# Patient Record
Sex: Male | Born: 1985 | Race: Black or African American | Hispanic: No | Marital: Single | Smoking: Former smoker
Health system: Southern US, Community
[De-identification: ages and names within clinical notes are randomized; demographics above are authoritative.]

---

## 2004-03-03 ENCOUNTER — Ambulatory Visit: Payer: Self-pay | Admitting: Internal Medicine

## 2004-06-20 ENCOUNTER — Ambulatory Visit: Payer: Self-pay | Admitting: Family Medicine

## 2004-12-13 ENCOUNTER — Emergency Department (HOSPITAL_COMMUNITY): Admission: EM | Admit: 2004-12-13 | Discharge: 2004-12-13 | Payer: Self-pay | Admitting: Emergency Medicine

## 2004-12-15 ENCOUNTER — Ambulatory Visit: Payer: Self-pay | Admitting: Family Medicine

## 2005-03-14 ENCOUNTER — Ambulatory Visit: Payer: Self-pay | Admitting: Family Medicine

## 2005-03-19 ENCOUNTER — Ambulatory Visit: Payer: Self-pay | Admitting: Family Medicine

## 2005-04-02 ENCOUNTER — Emergency Department (HOSPITAL_COMMUNITY): Admission: EM | Admit: 2005-04-02 | Discharge: 2005-04-03 | Payer: Self-pay | Admitting: Emergency Medicine

## 2005-04-03 ENCOUNTER — Ambulatory Visit: Payer: Self-pay | Admitting: Family Medicine

## 2005-04-06 ENCOUNTER — Ambulatory Visit: Payer: Self-pay | Admitting: Family Medicine

## 2012-01-11 ENCOUNTER — Emergency Department (HOSPITAL_COMMUNITY)
Admission: EM | Admit: 2012-01-11 | Discharge: 2012-01-11 | Disposition: A | Discharge status: Home / Self Care | Payer: Self-pay | Attending: Emergency Medicine | Admitting: Emergency Medicine

## 2012-01-11 ENCOUNTER — Encounter (HOSPITAL_COMMUNITY): Payer: Self-pay | Admitting: Emergency Medicine

## 2012-01-11 ENCOUNTER — Emergency Department (HOSPITAL_COMMUNITY): Payer: Self-pay

## 2012-01-11 DIAGNOSIS — J029 Acute pharyngitis, unspecified: Secondary | ICD-10-CM | POA: Insufficient documentation

## 2012-01-11 DIAGNOSIS — IMO0001 Reserved for inherently not codable concepts without codable children: Secondary | ICD-10-CM | POA: Insufficient documentation

## 2012-01-11 DIAGNOSIS — R5381 Other malaise: Secondary | ICD-10-CM | POA: Insufficient documentation

## 2012-01-11 DIAGNOSIS — R110 Nausea: Secondary | ICD-10-CM | POA: Insufficient documentation

## 2012-01-11 DIAGNOSIS — F17200 Nicotine dependence, unspecified, uncomplicated: Secondary | ICD-10-CM | POA: Insufficient documentation

## 2012-01-11 DIAGNOSIS — R062 Wheezing: Secondary | ICD-10-CM | POA: Insufficient documentation

## 2012-01-11 DIAGNOSIS — R5383 Other fatigue: Secondary | ICD-10-CM | POA: Insufficient documentation

## 2012-01-11 DIAGNOSIS — J189 Pneumonia, unspecified organism: Secondary | ICD-10-CM | POA: Insufficient documentation

## 2012-01-11 DIAGNOSIS — J3489 Other specified disorders of nose and nasal sinuses: Secondary | ICD-10-CM | POA: Insufficient documentation

## 2012-01-11 DIAGNOSIS — H9209 Otalgia, unspecified ear: Secondary | ICD-10-CM | POA: Insufficient documentation

## 2012-01-11 DIAGNOSIS — R05 Cough: Secondary | ICD-10-CM | POA: Insufficient documentation

## 2012-01-11 DIAGNOSIS — R509 Fever, unspecified: Secondary | ICD-10-CM | POA: Insufficient documentation

## 2012-01-11 DIAGNOSIS — R0602 Shortness of breath: Secondary | ICD-10-CM | POA: Insufficient documentation

## 2012-01-11 DIAGNOSIS — R059 Cough, unspecified: Secondary | ICD-10-CM | POA: Insufficient documentation

## 2012-01-11 MED ORDER — AZITHROMYCIN 250 MG PO TABS: 250.0000 mg | ORAL_TABLET | Freq: Every day | ORAL | Status: DC

## 2012-01-11 MED ORDER — AZITHROMYCIN 250 MG PO TABS
500.0000 mg | ORAL_TABLET | Freq: Once | ORAL | Status: AC
  Administered 2012-01-11: 500 mg via ORAL
  Filled 2012-01-11: qty 2

## 2012-01-11 NOTE — ED Provider Notes (Signed)
History     CSN: [id number]  Arrival date & time 01/11/12  1610   First MD Initiated Contact with Patient 01/11/12 1702      Chief Complaint  Patient presents with  . Generalized Body Aches  . Fever    (Consider location/radiation/quality/duration/timing/severity/associated sxs/prior treatment) HPI Comments: Patient presents with complaint of flulike symptoms for the past 6 days. He has had myalgias, nonproductive cough, fever to 102F, nasal congestion, sore throat, nausea but no vomiting. Nasal congestion, sore throat are improving. He has not had a flu shot this year and states that his child and wife have had similar symptoms prior and are now feeling better. Patient states that he was feeling better this morning and went to work. While he was at work he had trouble breathing, worsening cough, and wheezing. Patient does not have a history of asthma or bronchitis. He's been using Tylenol and Motrin and Robitussin for symptoms. He states that he has had pneumonia after having the flu in the past. No abdominal pain. Onset gradual. Course is persistent. Nothing makes symptoms better or worse.  Patient is a 26 y.o. male presenting with fever. The history is provided by the patient.  Fever Primary symptoms of the febrile illness include fever, fatigue, cough, wheezing, shortness of breath, nausea and myalgias. Primary symptoms do not include headaches, abdominal pain, vomiting, diarrhea, dysuria or rash.    History reviewed. No pertinent past medical history.  History reviewed. No pertinent past surgical history.  History reviewed. No pertinent family history.  History  Substance Use Topics  . Smoking status: Current Every Day Smoker    Types: Cigarettes  . Smokeless tobacco: Not on file  . Alcohol Use: No      Review of Systems  Constitutional: Positive for fever, chills and fatigue.  HENT: Positive for ear pain, congestion, sore throat and rhinorrhea. Negative for neck  stiffness and sinus pressure.   Eyes: Negative for redness.  Respiratory: Positive for cough, shortness of breath and wheezing.   Gastrointestinal: Positive for nausea. Negative for vomiting, abdominal pain and diarrhea.  Genitourinary: Negative for dysuria.  Musculoskeletal: Positive for myalgias.  Skin: Negative for rash.  Neurological: Negative for headaches.  Hematological: Negative for adenopathy.    Allergies  Review of patient's allergies indicates no known allergies.  Home Medications   Current Outpatient Rx  Name  Route  Sig  Dispense  Refill  . GUAIFENESIN 100 MG/5ML PO SYRP   Oral   Take 200 mg by mouth 3 (three) times daily as needed. Cough         . IBUPROFEN 200 MG PO TABS   Oral   Take 200 mg by mouth every 6 (six) hours as needed. Fever           BP 139/93  Pulse 101  Temp 100.2 F (37.9 C) (Oral)  Resp 18  SpO2 96%  Physical Exam  Nursing note and vitals reviewed. Constitutional: He appears well-developed and well-nourished.       Non-toxic appearance.   HENT:  Head: Normocephalic and atraumatic. No trismus in the jaw.  Right Ear: Tympanic membrane, external ear and ear canal normal.  Left Ear: Tympanic membrane, external ear and ear canal normal.  Nose: Mucosal edema and rhinorrhea present.  Mouth/Throat: Uvula is midline and mucous membranes are normal. Mucous membranes are not dry. No uvula swelling. Posterior oropharyngeal erythema present. No oropharyngeal exudate, posterior oropharyngeal edema or tonsillar abscesses.  Eyes: Conjunctivae normal are normal. Right eye  exhibits no discharge. Left eye exhibits no discharge.  Neck: Normal range of motion. Neck supple.  Cardiovascular: Normal rate, regular rhythm and normal heart sounds.   No murmur heard. Pulmonary/Chest: Effort normal and breath sounds normal. No respiratory distress. He has no wheezes. He has no rales.  Abdominal: Soft. There is no tenderness.  Lymphadenopathy:    He has  cervical adenopathy.  Neurological: He is alert.  Skin: Skin is warm and dry.  Psychiatric: He has a normal mood and affect.    ED Course  Procedures (including critical care time)  Labs Reviewed - No data to display Dg Chest 2 View  01/11/2012  *RADIOLOGY REPORT*  Clinical Data: Generalized body aches with fever and cough.  CHEST - 2 VIEW  Comparison: 04/03/2005  Findings: Two-view exam shows opacity in the right infrahilar region, suggesting pneumonia.  Prominent vascularity the right midlung is stable.  No focal airspace consolidation on the left. The cardiopericardial silhouette is within normal limits for size. Imaged bony structures of the thorax are intact.  IMPRESSION: Right infrahilar opacity, suspicious for pneumonia.   Original Report Authenticated By: Kennith Center, M.D.      1. Community acquired pneumonia     5:50 PM Patient seen and examined. CXR ordered to r/o PNA.   Vital signs reviewed and are as follows: Filed Vitals:   01/11/12 1629  BP: 139/93  Pulse: 101  Temp: 100.2 F (37.9 C)  Resp: 18   6:27 PM Chest x-ray reviewed by myself/radiologist. It is suspicious for pneumonia. Will treat as such. Patient informed of results.  Patient urged to return with worsening symptoms, worsening shortness of breath or work of breathing, high persistent fever, persistent vomiting, or if he has any other concerns. Patient verbalizes understanding and agrees with plan. He'll continue over-the-counter medications as needed for symptomatic relief.  MDM  Community-acquired pneumonia. This likely represents a post-influenza pneumonia. Patient otherwise appears well, nontoxic. Vital signs are stable. Patient can safely be treated as an outpatient given his history and exam. Will have patient stay home and rest for the next few days.        Renne Crigler, [place] 01/11/12 [phone]

## 2012-01-11 NOTE — ED Notes (Signed)
Pt has been having generalized body aches, nausea, (no vomiting) and fever x 1 week.

## 2012-01-11 NOTE — ED Provider Notes (Signed)
Medical screening examination/treatment/procedure(s) were performed by non-physician practitioner and as supervising physician I was immediately available for consultation/collaboration.    Nelia Shi, MD 01/11/12 [phone]

## 2012-01-11 NOTE — ED Notes (Signed)
Returned from XR 

## 2012-07-16 ENCOUNTER — Emergency Department (HOSPITAL_COMMUNITY)
Admission: EM | Admit: 2012-07-16 | Discharge: 2012-07-16 | Disposition: A | Discharge status: Home / Self Care | Payer: Self-pay | Attending: Emergency Medicine | Admitting: Emergency Medicine

## 2012-07-16 ENCOUNTER — Encounter (HOSPITAL_COMMUNITY): Payer: Self-pay | Admitting: Emergency Medicine

## 2012-07-16 DIAGNOSIS — Z23 Encounter for immunization: Secondary | ICD-10-CM | POA: Insufficient documentation

## 2012-07-16 DIAGNOSIS — Z79899 Other long term (current) drug therapy: Secondary | ICD-10-CM | POA: Insufficient documentation

## 2012-07-16 DIAGNOSIS — S61219A Laceration without foreign body of unspecified finger without damage to nail, initial encounter: Secondary | ICD-10-CM

## 2012-07-16 DIAGNOSIS — F17200 Nicotine dependence, unspecified, uncomplicated: Secondary | ICD-10-CM | POA: Insufficient documentation

## 2012-07-16 DIAGNOSIS — Y929 Unspecified place or not applicable: Secondary | ICD-10-CM | POA: Insufficient documentation

## 2012-07-16 DIAGNOSIS — Y939 Activity, unspecified: Secondary | ICD-10-CM | POA: Insufficient documentation

## 2012-07-16 DIAGNOSIS — S61209A Unspecified open wound of unspecified finger without damage to nail, initial encounter: Secondary | ICD-10-CM | POA: Insufficient documentation

## 2012-07-16 DIAGNOSIS — S61409A Unspecified open wound of unspecified hand, initial encounter: Secondary | ICD-10-CM | POA: Insufficient documentation

## 2012-07-16 DIAGNOSIS — W292XXA Contact with other powered household machinery, initial encounter: Secondary | ICD-10-CM | POA: Insufficient documentation

## 2012-07-16 MED ORDER — TETANUS-DIPHTH-ACELL PERTUSSIS 5-2.5-18.5 LF-MCG/0.5 IM SUSP
0.5000 mL | Freq: Once | INTRAMUSCULAR | Status: AC
  Administered 2012-07-16: 0.5 mL via INTRAMUSCULAR
  Filled 2012-07-16: qty 0.5

## 2012-07-16 NOTE — ED Notes (Signed)
PT. ACCIDENTALLY CUT HIMSELF WITH KNIFE WHILE WASHING DISHES THIS EVENING , PRESENTS WITH RIGHT DISTAL MIDDLE FINGER LACERATION /BLEEDING CONTROLLED /  DRESSING APPLIED AT TRIAGE.

## 2012-07-16 NOTE — ED Provider Notes (Signed)
History    CSN: [id number] Arrival date & time 07/16/12  0102  First MD Initiated Contact with Patient 07/16/12 0226     Chief Complaint  Patient presents with  . Laceration   HPI  History provided by the patient. The patient is a 27 year old male with no significant PMH who presents with laceration to his right fingertip. Patient was washing dishes and reached into a sink full of water and cut his finger on the knifes under the water. Patient was having bleeding but this was controlled with bandaging and pressure. Denies any other injury. Denies any weakness or numbness to the finger. Patient is unsure of his last tetanus shot. No other aggravating or alleviating factors no other associated symptoms.    History reviewed. No pertinent past medical history. History reviewed. No pertinent past surgical history. No family history on file. History  Substance Use Topics  . Smoking status: Current Every Day Smoker    Types: Cigarettes  . Smokeless tobacco: Not on file  . Alcohol Use: No    Review of Systems  Neurological: Negative for weakness and numbness.  All other systems reviewed and are negative.    Allergies  Review of patient's allergies indicates no known allergies.  Home Medications   Current Outpatient Rx  Name  Route  Sig  Dispense  Refill  . azithromycin (ZITHROMAX) 250 MG tablet   Oral   Take 1 tablet (250 mg total) by mouth daily.   4 tablet   0   . guaifenesin (ROBITUSSIN) 100 MG/5ML syrup   Oral   Take 200 mg by mouth 3 (three) times daily as needed. Cough         . ibuprofen (ADVIL,MOTRIN) 200 MG tablet   Oral   Take 200 mg by mouth every 6 (six) hours as needed. Fever          BP 126/90  Pulse 100  Temp(Src) 98.2 F (36.8 C) (Oral)  Resp 20  SpO2 97% Physical Exam  Nursing note and vitals reviewed. Constitutional: He is oriented to person, place, and time. He appears well-developed and well-nourished. No distress.  HENT:  Head:  Normocephalic.  Eyes: Conjunctivae are normal.  Cardiovascular: Normal rate and regular rhythm.   Pulmonary/Chest: Effort normal and breath sounds normal.  Abdominal: Soft.  Musculoskeletal: Normal range of motion.  Irregular laceration to the pad of the right third fingertip. Bleeding controlled. Normal sensations in Refill. Normal range of motion of the finger. No deep structure involvement.  Neurological: He is alert and oriented to person, place, and time.  Skin: Skin is warm.  Psychiatric: He has a normal mood and affect. His behavior is normal.    ED Course  Procedures   LACERATION REPAIR Performed by: Angus Seller Authorized by: Angus Seller Consent: Verbal consent obtained. Risks and benefits: risks, benefits and alternatives were discussed Consent given by: patient Patient identity confirmed: provided demographic data Prepped and Draped in normal sterile fashion Wound explored  Laceration Location: Tip of right third finger  Laceration Length: 1 cm  No Foreign Bodies seen or palpated  Anesthesia: Digital block   Local anesthetic: lidocaine 2% without epinephrine  Anesthetic total: 4 ml  Irrigation method: syringe Amount of cleaning: standard  Skin closure: Skin with 5-0 Vicryl   Number of sutures: 4   Technique: Simple interrupted   Patient tolerance: Patient tolerated the procedure well with no immediate complications.    1. Laceration of finger of right hand, initial encounter  MDM  Patient seen and evaluated. Patient in no acute distress appears well. Tetanus updated    Angus Seller, PA-C 07/17/12 [phone]

## 2012-07-17 NOTE — ED Provider Notes (Signed)
Medical screening examination/treatment/procedure(s) were performed by non-physician practitioner and as supervising physician I was immediately available for consultation/collaboration.   Franchot Pollitt M Destenee Guerry, DO 07/17/12 2043 

## 2012-10-11 ENCOUNTER — Encounter (HOSPITAL_COMMUNITY): Payer: Self-pay | Admitting: Emergency Medicine

## 2012-10-11 ENCOUNTER — Emergency Department (HOSPITAL_COMMUNITY)
Admission: EM | Admit: 2012-10-11 | Discharge: 2012-10-11 | Disposition: A | Discharge status: Home / Self Care | Payer: Self-pay | Attending: Emergency Medicine | Admitting: Emergency Medicine

## 2012-10-11 ENCOUNTER — Emergency Department (HOSPITAL_COMMUNITY): Payer: Self-pay

## 2012-10-11 DIAGNOSIS — R079 Chest pain, unspecified: Secondary | ICD-10-CM | POA: Insufficient documentation

## 2012-10-11 DIAGNOSIS — F17200 Nicotine dependence, unspecified, uncomplicated: Secondary | ICD-10-CM | POA: Insufficient documentation

## 2012-10-11 DIAGNOSIS — Z8701 Personal history of pneumonia (recurrent): Secondary | ICD-10-CM | POA: Insufficient documentation

## 2012-10-11 DIAGNOSIS — R509 Fever, unspecified: Secondary | ICD-10-CM | POA: Insufficient documentation

## 2012-10-11 LAB — POCT I-STAT TROPONIN I: Troponin i, poc: 0 ng/mL (ref 0.00–0.08)

## 2012-10-11 LAB — CBC WITH DIFFERENTIAL/PLATELET
Basophils Absolute: 0 10*3/uL (ref 0.0–0.1)
Basophils Relative: 0 % (ref 0–1)
Eosinophils Absolute: 0 10*3/uL (ref 0.0–0.7)
Eosinophils Relative: 1 % (ref 0–5)
HCT: 43.2 % (ref 39.0–52.0)
Hemoglobin: 14.8 g/dL (ref 13.0–17.0)
Lymphocytes Relative: 53 % — ABNORMAL HIGH (ref 12–46)
Lymphs Abs: 1.9 10*3/uL (ref 0.7–4.0)
MCH: 31.1 pg (ref 26.0–34.0)
MCHC: 34.3 g/dL (ref 30.0–36.0)
MCV: 90.8 fL (ref 78.0–100.0)
Monocytes Absolute: 0.3 10*3/uL (ref 0.1–1.0)
Monocytes Relative: 9 % (ref 3–12)
Neutro Abs: 1.4 10*3/uL — ABNORMAL LOW (ref 1.7–7.7)
Neutrophils Relative %: 38 % — ABNORMAL LOW (ref 43–77)
Platelets: 199 10*3/uL (ref 150–400)
RBC: 4.76 MIL/uL (ref 4.22–5.81)
RDW: 13.2 % (ref 11.5–15.5)
WBC: 3.7 10*3/uL — ABNORMAL LOW (ref 4.0–10.5)

## 2012-10-11 LAB — BASIC METABOLIC PANEL
BUN: 15 mg/dL (ref 6–23)
CO2: 28 mEq/L (ref 19–32)
Calcium: 9 mg/dL (ref 8.4–10.5)
Chloride: 103 mEq/L (ref 96–112)
Creatinine, Ser: 1.15 mg/dL (ref 0.50–1.35)
GFR calc Af Amer: >90 mL/min (ref >90)
GFR calc non Af Amer: 85 mL/min — ABNORMAL LOW (ref >90)
Glucose, Bld: 103 mg/dL — ABNORMAL HIGH (ref 70–99)
Potassium: 4.3 mEq/L (ref 3.5–5.1)
Sodium: 139 mEq/L (ref 135–145)

## 2012-10-11 LAB — D-DIMER, QUANTITATIVE: D-Dimer, Quant: <0.27 ug/mL-FEU (ref 0.00–0.48)

## 2012-10-11 MED ORDER — MORPHINE SULFATE 4 MG/ML IJ SOLN
4.0000 mg | Freq: Once | INTRAMUSCULAR | Status: AC
  Administered 2012-10-11: 4 mg via INTRAVENOUS
  Filled 2012-10-11: qty 1

## 2012-10-11 MED ORDER — ALBUTEROL SULFATE HFA 108 (90 BASE) MCG/ACT IN AERS
2.0000 | INHALATION_SPRAY | RESPIRATORY_TRACT | Status: DC | PRN
  Administered 2012-10-11: 2 via RESPIRATORY_TRACT
  Filled 2012-10-11: qty 6.7

## 2012-10-11 MED ORDER — SODIUM CHLORIDE 0.9 % IV BOLUS (SEPSIS)
1000.0000 mL | Freq: Once | INTRAVENOUS | Status: AC
Start: 2012-10-11 — End: 2012-10-11
  Administered 2012-10-11: 1000 mL via INTRAVENOUS

## 2012-10-11 NOTE — ED Provider Notes (Signed)
CSN: [id number]     Arrival date & time 10/11/12  1724 History   First MD Initiated Contact with Patient 10/11/12 1734     Chief Complaint  Patient presents with  . Chest Pain   (Consider location/radiation/quality/duration/timing/severity/associated sxs/prior Treatment) HPI Comments: Patient presents to the emergency department with chief complaint of chest pain. He states the pain started yesterday. It is been persistent until now. It is worsened with deep breathing, and with movement. Patient states that he has had associated cough, but it has not been productive. He endorses subjective fevers and chills. He states that he had pneumonia in February, and this feels similar. He denies the use of any illicit drugs, and states that he recently quit smoking 2 days ago.  The history is provided by the patient. No language interpreter was used.    History reviewed. No pertinent past medical history. History reviewed. No pertinent past surgical history. No family history on file. History  Substance Use Topics  . Smoking status: Current Every Day Smoker    Types: Cigarettes  . Smokeless tobacco: Not on file  . Alcohol Use: No    Review of Systems  All other systems reviewed and are negative.    Allergies  Review of patient's allergies indicates no known allergies.  Home Medications   Current Outpatient Rx  Name  Route  Sig  Dispense  Refill  . azithromycin (ZITHROMAX) 250 MG tablet   Oral   Take 1 tablet (250 mg total) by mouth daily.   4 tablet   0   . guaifenesin (ROBITUSSIN) 100 MG/5ML syrup   Oral   Take 200 mg by mouth 3 (three) times daily as needed. Cough         . ibuprofen (ADVIL,MOTRIN) 200 MG tablet   Oral   Take 200 mg by mouth every 6 (six) hours as needed. Fever          BP 135/84  Pulse 99  Temp(Src) 99.1 F (37.3 C)  Resp 20  SpO2 99% Physical Exam  Nursing note and vitals reviewed. Constitutional: He is oriented to person, place, and time. He  appears well-developed and well-nourished.  HENT:  Head: Normocephalic and atraumatic.  Eyes: Conjunctivae and EOM are normal. Pupils are equal, round, and reactive to light. Right eye exhibits no discharge. Left eye exhibits no discharge. No scleral icterus.  Neck: Normal range of motion. Neck supple. No JVD present.  Cardiovascular: Normal rate, regular rhythm, normal heart sounds and intact distal pulses.  Exam reveals no gallop and no friction rub.   No murmur heard. Pulmonary/Chest: Effort normal and breath sounds normal. No respiratory distress. He has no wheezes. He has no rales. He exhibits no tenderness.  Abdominal: Soft. Bowel sounds are normal. He exhibits no distension and no mass. There is no tenderness. There is no rebound and no guarding.  Musculoskeletal: Normal range of motion. He exhibits no edema and no tenderness.  Neurological: He is alert and oriented to person, place, and time.  Skin: Skin is warm and dry.  Psychiatric: He has a normal mood and affect. His behavior is normal. Judgment and thought content normal.    ED Course  Procedures (including critical care time) Results for orders placed during the hospital encounter of 10/11/12  CBC WITH DIFFERENTIAL      Result Value Range   WBC 3.7 (*) 4.0 - 10.5 K/uL   RBC 4.76  4.22 - 5.81 MIL/uL   Hemoglobin 14.8  13.0 -  17.0 g/dL   HCT 40.9  81.1 - 91.4 %   MCV 90.8  78.0 - 100.0 fL   MCH 31.1  26.0 - 34.0 pg   MCHC 34.3  30.0 - 36.0 g/dL   RDW 78.2  95.6 - 21.3 %   Platelets 199  150 - 400 K/uL   Neutrophils Relative % 38 (*) 43 - 77 %   Neutro Abs 1.4 (*) 1.7 - 7.7 K/uL   Lymphocytes Relative 53 (*) 12 - 46 %   Lymphs Abs 1.9  0.7 - 4.0 K/uL   Monocytes Relative 9  3 - 12 %   Monocytes Absolute 0.3  0.1 - 1.0 K/uL   Eosinophils Relative 1  0 - 5 %   Eosinophils Absolute 0.0  0.0 - 0.7 K/uL   Basophils Relative 0  0 - 1 %   Basophils Absolute 0.0  0.0 - 0.1 K/uL  BASIC METABOLIC PANEL      Result Value Range    Sodium 139  135 - 145 mEq/L   Potassium 4.3  3.5 - 5.1 mEq/L   Chloride 103  96 - 112 mEq/L   CO2 28  19 - 32 mEq/L   Glucose, Bld 103 (*) 70 - 99 mg/dL   BUN 15  6 - 23 mg/dL   Creatinine, Ser 0.86  0.50 - 1.35 mg/dL   Calcium 9.0  8.4 - 57.8 mg/dL   GFR calc non Af Amer 85 (*) >90 mL/min   GFR calc Af Amer >90  >90 mL/min  D-DIMER, QUANTITATIVE      Result Value Range   D-Dimer, Quant <0.27  0.00 - 0.48 ug/mL-FEU  POCT I-STAT TROPONIN I      Result Value Range   Troponin i, poc 0.00  0.00 - 0.08 ng/mL   Comment 3            Dg Chest 2 View  10/11/2012   CLINICAL DATA:  Mid chest pain, shortness of breath, smoker  EXAM: CHEST  2 VIEW  COMPARISON:  01/10/2013  FINDINGS: Normal heart size, mediastinal contours, and pulmonary vascularity.  Lungs clear.  No pleural effusion or pneumothorax.  Bones unremarkable.  Clothing artifacts project over left chest.  IMPRESSION: No acute abnormalities.   Electronically Signed   By: Ulyses Southward M.D.   On: 10/11/2012 18:42   ED ECG REPORT  I personally interpreted this EKG   Date: 10/11/2012   Rate: 100  Rhythm: sinus tachycardia  QRS Axis: normal  Intervals: normal  ST/T Wave abnormalities: normal  Conduction Disutrbances:none  Narrative Interpretation:   Old EKG Reviewed: none available     MDM   1. Chest pain     Patient with chest pain x1 day. Also endorses shortness of breath. States that he has had some cough, and subjective fevers and chills. Patient is tachycardic to 100-110 on my exam, and this is not make PERC criteria, will order a d-dimer. Will check basic labs, give some pain medicine, and will reevaluate.  7:44 PM Patient is feeling improved. D-dimer is negative. Troponin is negative. Patient low risk for ACS. EKG is normal. Patient is no longer tachycardic. Suspect that the chest pain/cough could be related to smoking history. 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 I'm going to give the patient an  inhaler, and discharged to home. Doubt acute or emergent process. He is resting comfortably. Patient discussed with Dr. Jeraldine Loots, who agrees with the treatment and discharge  plan.    Roxy Horseman, PA-C 10/11/12 1948

## 2012-10-11 NOTE — ED Notes (Signed)
Patient transported to X-ray 

## 2012-10-11 NOTE — ED Provider Notes (Signed)
  Medical screening examination/treatment/procedure(s) were performed by non-physician practitioner and as supervising physician I was immediately available for consultation/collaboration.    Gerhard Munch, MD 10/11/12 [phone]

## 2012-10-11 NOTE — ED Notes (Signed)
Pt from home reports that he has CP that started yesterday, approx 24 hrs ago. Pt denies N/V/D or fever, but states some SOB and dizziness yesterday. Pt is A&O and in NAD

## 2012-12-03 ENCOUNTER — Encounter (HOSPITAL_COMMUNITY): Payer: Self-pay | Admitting: Emergency Medicine

## 2012-12-03 ENCOUNTER — Emergency Department (HOSPITAL_COMMUNITY)
Admission: EM | Admit: 2012-12-03 | Discharge: 2012-12-03 | Disposition: A | Discharge status: Home / Self Care | Payer: Self-pay | Attending: Emergency Medicine | Admitting: Emergency Medicine

## 2012-12-03 DIAGNOSIS — M545 Low back pain, unspecified: Secondary | ICD-10-CM | POA: Insufficient documentation

## 2012-12-03 DIAGNOSIS — J029 Acute pharyngitis, unspecified: Secondary | ICD-10-CM | POA: Insufficient documentation

## 2012-12-03 DIAGNOSIS — R059 Cough, unspecified: Secondary | ICD-10-CM | POA: Insufficient documentation

## 2012-12-03 DIAGNOSIS — R079 Chest pain, unspecified: Secondary | ICD-10-CM | POA: Insufficient documentation

## 2012-12-03 DIAGNOSIS — J111 Influenza due to unidentified influenza virus with other respiratory manifestations: Secondary | ICD-10-CM | POA: Insufficient documentation

## 2012-12-03 DIAGNOSIS — F17200 Nicotine dependence, unspecified, uncomplicated: Secondary | ICD-10-CM | POA: Insufficient documentation

## 2012-12-03 DIAGNOSIS — Z79899 Other long term (current) drug therapy: Secondary | ICD-10-CM | POA: Insufficient documentation

## 2012-12-03 DIAGNOSIS — R05 Cough: Secondary | ICD-10-CM | POA: Insufficient documentation

## 2012-12-03 DIAGNOSIS — R51 Headache: Secondary | ICD-10-CM | POA: Insufficient documentation

## 2012-12-03 DIAGNOSIS — R509 Fever, unspecified: Secondary | ICD-10-CM | POA: Insufficient documentation

## 2012-12-03 DIAGNOSIS — M25569 Pain in unspecified knee: Secondary | ICD-10-CM | POA: Insufficient documentation

## 2012-12-03 MED ORDER — OSELTAMIVIR PHOSPHATE 75 MG PO CAPS: 75.0000 mg | ORAL_CAPSULE | Freq: Two times a day (BID) | ORAL | Status: DC

## 2012-12-03 NOTE — ED Notes (Signed)
Pt alert and oriented, with steady gait at time of discharge. Pt given discharge papers and papers explained. All questions answered and pt walked to discharge.  

## 2012-12-03 NOTE — ED Notes (Signed)
Pt. reports generalized body aches , joints pain , nasal congestion , occasional productive cough and chest congestion for several days .

## 2012-12-03 NOTE — ED Provider Notes (Signed)
CSN: [id number]     Arrival date & time 12/03/12  2033 History  This chart was scribed for non-physician practitioner, Junius Finner, PA-C,working with Raeford Razor, MD, by Karle Plumber, ED Scribe.  This patient was seen in room TR11C/TR11C and the patient's care was started at 10:18 PM.  Chief Complaint  Patient presents with  . Generalized Body Aches  . Chills   The history is provided by the patient. No language interpreter was used.   HPI Comments:  Mario Greene is a 27 y.o. male who presents to the Emergency Department complaining of body aches, lower back pain, aching knees, headaches, chest pain, sore throat and intermittent productive cough. Pt states the symptoms started approximately 24 hours ago. He reports his Tmax at 101 degrees. Pt states he has taken TheraFlu at home with no relief. He states his children were sick last week. Pt denies abdominal pain, nausea, vomiting, or diarrhea. He denies having a flu vaccination this year. Pt states he has an inhaler he uses about twice a month. Pt states he has no PCP.  History reviewed. No pertinent past medical history. History reviewed. No pertinent past surgical history. No family history on file. History  Substance Use Topics  . Smoking status: Current Every Day Smoker    Types: Cigarettes  . Smokeless tobacco: Not on file  . Alcohol Use: No    Review of Systems  Constitutional: Positive for fever and chills.  HENT: Positive for sore throat.   Respiratory: Positive for cough.   Gastrointestinal: Negative for nausea, vomiting, abdominal pain and diarrhea.  Musculoskeletal: Positive for arthralgias and back pain.  Neurological: Positive for headaches.  All other systems reviewed and are negative.    Allergies  Review of patient's allergies indicates no known allergies.  Home Medications   Current Outpatient Rx  Name  Route  Sig  Dispense  Refill  . ibuprofen (ADVIL,MOTRIN) 200 MG tablet   Oral   Take 400 mg by  mouth every 6 (six) hours as needed for pain or headache. Fever         . tetrahydrozoline (VISINE) 0.05 % ophthalmic solution   Both Eyes   Place 1 drop into both eyes as needed (eye irritation).         Marland Kitchen oseltamivir (TAMIFLU) 75 MG capsule   Oral   Take 1 capsule (75 mg total) by mouth every 12 (twelve) hours.   10 capsule   0    Triage Vitals: BP 130/88  Pulse 91  Temp(Src) 99.6 F (37.6 C) (Oral)  Resp 18  Ht 6\' 5"  (1.956 m)  Wt 173 lb 3.2 oz (78.563 kg)  BMI 20.53 kg/m2  SpO2 98% Physical Exam  Nursing note and vitals reviewed. Constitutional: He is oriented to person, place, and time. He appears well-developed and well-nourished. No distress.  Sitting on exam bed. Does not appear to feel well. No respiratory distress.   HENT:  Head: Normocephalic and atraumatic.  Nasal mucosal edema.   Eyes: EOM are normal.  Neck: Normal range of motion.  Cardiovascular: Normal rate.   Pulmonary/Chest: Effort normal and breath sounds normal. No respiratory distress. He has no wheezes. He has no rales. He exhibits no tenderness.  Abdominal: Soft. He exhibits no distension and no mass. There is no tenderness. There is no rebound and no guarding.  Musculoskeletal: Normal range of motion.  Neurological: He is alert and oriented to person, place, and time.  Skin: Skin is warm and dry.  Psychiatric: He has a normal mood and affect. His behavior is normal.    ED Course  Procedures (including critical care time) DIAGNOSTIC STUDIES: Oxygen Saturation is 98% on RA, normal by my interpretation.   COORDINATION OF CARE: 10:21 PM- Will prescribe pt TamiFlu. Advised pt to stay well hydrated and take Tylenol or Advil for fevers and chills. Pt verbalizes understanding and agrees to plan.  Medications - No data to display  Labs Review Labs Reviewed - No data to display Imaging Review No results found.  EKG Interpretation   None       MDM   1. Flu syndrome    I personally  performed the services described in this documentation, which was scribed in my presence. The recorded information has been reviewed and is accurate.    Junius Finner, PA-C 12/03/12 [phone]

## 2012-12-04 NOTE — ED Provider Notes (Signed)
Medical screening examination/treatment/procedure(s) were performed by non-physician practitioner and as supervising physician I was immediately available for consultation/collaboration.  EKG Interpretation   None        Libra Gatz, MD 12/04/12 0104 

## 2013-04-03 ENCOUNTER — Emergency Department (HOSPITAL_COMMUNITY): Admission: EM | Admit: 2013-04-03 | Discharge: 2013-04-03 | Payer: Self-pay

## 2013-04-03 ENCOUNTER — Emergency Department (HOSPITAL_COMMUNITY)
Admission: EM | Admit: 2013-04-03 | Discharge: 2013-04-03 | Disposition: A | Discharge status: Home / Self Care | Payer: Self-pay | Attending: Emergency Medicine | Admitting: Emergency Medicine

## 2013-04-03 ENCOUNTER — Emergency Department (HOSPITAL_COMMUNITY): Payer: Self-pay

## 2013-04-03 ENCOUNTER — Encounter (HOSPITAL_COMMUNITY): Payer: Self-pay | Admitting: Emergency Medicine

## 2013-04-03 DIAGNOSIS — R509 Fever, unspecified: Secondary | ICD-10-CM | POA: Insufficient documentation

## 2013-04-03 DIAGNOSIS — R079 Chest pain, unspecified: Secondary | ICD-10-CM | POA: Insufficient documentation

## 2013-04-03 DIAGNOSIS — R1012 Left upper quadrant pain: Secondary | ICD-10-CM | POA: Insufficient documentation

## 2013-04-03 DIAGNOSIS — Z87891 Personal history of nicotine dependence: Secondary | ICD-10-CM | POA: Insufficient documentation

## 2013-04-03 DIAGNOSIS — R1110 Vomiting, unspecified: Secondary | ICD-10-CM

## 2013-04-03 DIAGNOSIS — E860 Dehydration: Secondary | ICD-10-CM | POA: Insufficient documentation

## 2013-04-03 DIAGNOSIS — M542 Cervicalgia: Secondary | ICD-10-CM | POA: Insufficient documentation

## 2013-04-03 DIAGNOSIS — Z79899 Other long term (current) drug therapy: Secondary | ICD-10-CM | POA: Insufficient documentation

## 2013-04-03 DIAGNOSIS — M546 Pain in thoracic spine: Secondary | ICD-10-CM | POA: Insufficient documentation

## 2013-04-03 DIAGNOSIS — R112 Nausea with vomiting, unspecified: Secondary | ICD-10-CM | POA: Insufficient documentation

## 2013-04-03 LAB — CBC WITH DIFFERENTIAL/PLATELET
Basophils Absolute: 0 10*3/uL (ref 0.0–0.1)
Basophils Relative: 0 % (ref 0–1)
Eosinophils Absolute: 0 10*3/uL (ref 0.0–0.7)
Eosinophils Relative: 1 % (ref 0–5)
HCT: 45.6 % (ref 39.0–52.0)
Hemoglobin: 16.1 g/dL (ref 13.0–17.0)
Lymphocytes Relative: 37 % (ref 12–46)
Lymphs Abs: 1.9 10*3/uL (ref 0.7–4.0)
MCH: 31.9 pg (ref 26.0–34.0)
MCHC: 35.3 g/dL (ref 30.0–36.0)
MCV: 90.3 fL (ref 78.0–100.0)
Monocytes Absolute: 0.4 10*3/uL (ref 0.1–1.0)
Monocytes Relative: 7 % (ref 3–12)
Neutro Abs: 2.8 10*3/uL (ref 1.7–7.7)
Neutrophils Relative %: 55 % (ref 43–77)
Platelets: 197 10*3/uL (ref 150–400)
RBC: 5.05 MIL/uL (ref 4.22–5.81)
RDW: 13.1 % (ref 11.5–15.5)
WBC: 5.2 10*3/uL (ref 4.0–10.5)

## 2013-04-03 LAB — URINALYSIS, ROUTINE W REFLEX MICROSCOPIC
Glucose, UA: NEGATIVE mg/dL
Hgb urine dipstick: NEGATIVE
Ketones, ur: 15 mg/dL — AB
Leukocytes, UA: NEGATIVE
Nitrite: NEGATIVE
Protein, ur: NEGATIVE mg/dL
Specific Gravity, Urine: 1.028 (ref 1.005–1.030)
Urobilinogen, UA: 0.2 mg/dL (ref 0.0–1.0)
pH: 5 (ref 5.0–8.0)

## 2013-04-03 LAB — COMPREHENSIVE METABOLIC PANEL
ALT: 8 U/L (ref 0–53)
AST: 15 U/L (ref 0–37)
Albumin: 4.3 g/dL (ref 3.5–5.2)
Alkaline Phosphatase: 59 U/L (ref 39–117)
BUN: 14 mg/dL (ref 6–23)
CO2: 28 mEq/L (ref 19–32)
Calcium: 9.5 mg/dL (ref 8.4–10.5)
Chloride: 102 mEq/L (ref 96–112)
Creatinine, Ser: 1.19 mg/dL (ref 0.50–1.35)
GFR calc Af Amer: >90 mL/min (ref >90)
GFR calc non Af Amer: 81 mL/min — ABNORMAL LOW (ref >90)
Glucose, Bld: 93 mg/dL (ref 70–99)
Potassium: 4.2 mEq/L (ref 3.7–5.3)
Sodium: 142 mEq/L (ref 137–147)
Total Bilirubin: 0.7 mg/dL (ref 0.3–1.2)
Total Protein: 7.4 g/dL (ref 6.0–8.3)

## 2013-04-03 LAB — LIPASE, BLOOD: Lipase: 27 U/L (ref 11–59)

## 2013-04-03 LAB — D-DIMER, QUANTITATIVE: D-Dimer, Quant: <0.27 ug/mL-FEU (ref 0.00–0.48)

## 2013-04-03 MED ORDER — SODIUM CHLORIDE 0.9 % IV BOLUS (SEPSIS): 1000.0000 mL | Freq: Once | INTRAVENOUS | Status: DC

## 2013-04-03 MED ORDER — ONDANSETRON 8 MG PO TBDP: ORAL_TABLET | ORAL | Status: DC

## 2013-04-03 MED ORDER — ONDANSETRON HCL 4 MG/2ML IJ SOLN
4.0000 mg | Freq: Once | INTRAMUSCULAR | Status: AC
  Administered 2013-04-03: 4 mg via INTRAVENOUS
  Filled 2013-04-03: qty 2

## 2013-04-03 MED ORDER — SODIUM CHLORIDE 0.9 % IV BOLUS (SEPSIS)
1000.0000 mL | Freq: Once | INTRAVENOUS | Status: AC
  Administered 2013-04-03: 1000 mL via INTRAVENOUS

## 2013-04-03 MED ORDER — KETOROLAC TROMETHAMINE 30 MG/ML IJ SOLN
30.0000 mg | Freq: Once | INTRAMUSCULAR | Status: AC
  Administered 2013-04-03: 30 mg via INTRAVENOUS
  Filled 2013-04-03: qty 1

## 2013-04-03 NOTE — Discharge Instructions (Signed)

## 2013-04-03 NOTE — ED Notes (Signed)
Pt ambulated in hallway and maintained an oxygen sat of 96-100%. He stated he was still dizzy but that it was not as bad as it was earlier. Pt stated the dizziness was similar to being drunk. Pt. Returned to bed with an oxygen saturation of 100%.

## 2013-04-03 NOTE — ED Provider Notes (Signed)
CSN: [id number]     Arrival date & time 04/03/13  1006 History   First MD Initiated Contact with Patient 04/03/13 1143     Chief Complaint  Patient presents with  . Emesis     (Consider location/radiation/quality/duration/timing/severity/associated sxs/prior Treatment) HPI Comments: Patient presents with vomiting and dizziness. He states he woke up yesterday morning with some feeling of being lightheaded and has to have the sensation that the room is spinning. He's had 3-4 episodes of vomiting since that time. We will copy also noted some pain in the back of his neck. He denies any headache. He's had similar type pain when he has slept on his neck wrong. He denies any back pain. He has had some pain in the left lower chest and left upper abdomen. He says it's worse when he takes deep breaths. He denies any diarrhea. He felt feverish this morning but is more because he was sweaty. He hasn't checked his temperature at home. He denies any other myalgias. He denies any rashes. He denies any recent traumatic injuries.  Patient is a 28 y.o. male presenting with vomiting.  Emesis Associated symptoms: abdominal pain   Associated symptoms: no arthralgias, no chills, no diarrhea and no headaches     History reviewed. No pertinent past medical history. History reviewed. No pertinent past surgical history. History reviewed. No pertinent family history. History  Substance Use Topics  . Smoking status: Former Smoker    Types: Cigarettes  . Smokeless tobacco: Not on file  . Alcohol Use: No    Review of Systems  Constitutional: Positive for fever and fatigue. Negative for chills and diaphoresis.  HENT: Negative for congestion, rhinorrhea and sneezing.   Eyes: Negative.   Respiratory: Negative for cough, chest tightness and shortness of breath.   Cardiovascular: Positive for chest pain. Negative for leg swelling.  Gastrointestinal: Positive for nausea, vomiting and abdominal pain. Negative for  diarrhea and blood in stool.  Genitourinary: Negative for frequency, hematuria, flank pain and difficulty urinating.  Musculoskeletal: Positive for neck pain. Negative for arthralgias and back pain.  Skin: Negative for rash.  Neurological: Positive for dizziness and light-headedness. Negative for speech difficulty, weakness, numbness and headaches.      Allergies  Review of patient's allergies indicates no known allergies.  Home Medications   Current Outpatient Rx  Name  Route  Sig  Dispense  Refill  . albuterol (PROVENTIL HFA;VENTOLIN HFA) 108 (90 BASE) MCG/ACT inhaler   Inhalation   Inhale 1 puff into the lungs every 6 (six) hours as needed for shortness of breath.         Marland Kitchen ibuprofen (ADVIL,MOTRIN) 200 MG tablet   Oral   Take 400 mg by mouth every 6 (six) hours as needed for pain or headache. Fever         . ondansetron (ZOFRAN ODT) 8 MG disintegrating tablet      8mg  ODT q4 hours prn nausea   4 tablet   0    BP 133/93  Pulse 71  Temp(Src) 98.7 F (37.1 C) (Oral)  Resp 18  Ht 6\' 5"  (1.956 m)  Wt 175 lb 8 oz (79.606 kg)  BMI 20.81 kg/m2  SpO2 100% Physical Exam  Constitutional: He is oriented to person, place, and time. He appears well-developed and well-nourished.  HENT:  Head: Normocephalic and atraumatic.  Mouth/Throat: Oropharynx is clear and moist.  Eyes: EOM are normal. Pupils are equal, round, and reactive to light.  No nystagmus  Neck: Normal range  of motion. Neck supple.  Cardiovascular: Normal rate, regular rhythm and normal heart sounds.   Pulmonary/Chest: Effort normal and breath sounds normal. No respiratory distress. He has no wheezes. He has no rales. He exhibits no tenderness.  Abdominal: Soft. Bowel sounds are normal. There is tenderness (mild tenderness to left upper quadrant). There is no rebound and no guarding.  Musculoskeletal: Normal range of motion. He exhibits no edema.  Mild tenderness along the lower cervical spine and upper back  along the spine in the musculature bilaterally  Lymphadenopathy:    He has no cervical adenopathy.  Neurological: He is alert and oriented to person, place, and time.  Skin: Skin is warm and dry. No rash noted.  Psychiatric: He has a normal mood and affect.    ED Course  Procedures (including critical care time) Labs Review Results for orders placed during the hospital encounter of 04/03/13  CBC WITH DIFFERENTIAL      Result Value Ref Range   WBC 5.2  4.0 - 10.5 K/uL   RBC 5.05  4.22 - 5.81 MIL/uL   Hemoglobin 16.1  13.0 - 17.0 g/dL   HCT 16.1  09.6 - 04.5 %   MCV 90.3  78.0 - 100.0 fL   MCH 31.9  26.0 - 34.0 pg   MCHC 35.3  30.0 - 36.0 g/dL   RDW 40.9  81.1 - 91.4 %   Platelets 197  150 - 400 K/uL   Neutrophils Relative % 55  43 - 77 %   Neutro Abs 2.8  1.7 - 7.7 K/uL   Lymphocytes Relative 37  12 - 46 %   Lymphs Abs 1.9  0.7 - 4.0 K/uL   Monocytes Relative 7  3 - 12 %   Monocytes Absolute 0.4  0.1 - 1.0 K/uL   Eosinophils Relative 1  0 - 5 %   Eosinophils Absolute 0.0  0.0 - 0.7 K/uL   Basophils Relative 0  0 - 1 %   Basophils Absolute 0.0  0.0 - 0.1 K/uL  COMPREHENSIVE METABOLIC PANEL      Result Value Ref Range   Sodium 142  137 - 147 mEq/L   Potassium 4.2  3.7 - 5.3 mEq/L   Chloride 102  96 - 112 mEq/L   CO2 28  19 - 32 mEq/L   Glucose, Bld 93  70 - 99 mg/dL   BUN 14  6 - 23 mg/dL   Creatinine, Ser 7.82  0.50 - 1.35 mg/dL   Calcium 9.5  8.4 - 95.6 mg/dL   Total Protein 7.4  6.0 - 8.3 g/dL   Albumin 4.3  3.5 - 5.2 g/dL   AST 15  0 - 37 U/L   ALT 8  0 - 53 U/L   Alkaline Phosphatase 59  39 - 117 U/L   Total Bilirubin 0.7  0.3 - 1.2 mg/dL   GFR calc non Af Amer 81 (*) >90 mL/min   GFR calc Af Amer >90  >90 mL/min  LIPASE, BLOOD      Result Value Ref Range   Lipase 27  11 - 59 U/L  URINALYSIS, ROUTINE W REFLEX MICROSCOPIC      Result Value Ref Range   Color, Urine AMBER (*) YELLOW   APPearance CLEAR  CLEAR   Specific Gravity, Urine 1.028  1.005 - 1.030   pH  5.0  5.0 - 8.0   Glucose, UA NEGATIVE  NEGATIVE mg/dL   Hgb urine dipstick NEGATIVE  NEGATIVE  Bilirubin Urine SMALL (*) NEGATIVE   Ketones, ur 15 (*) NEGATIVE mg/dL   Protein, ur NEGATIVE  NEGATIVE mg/dL   Urobilinogen, UA 0.2  0.0 - 1.0 mg/dL   Nitrite NEGATIVE  NEGATIVE   Leukocytes, UA NEGATIVE  NEGATIVE  D-DIMER, QUANTITATIVE      Result Value Ref Range   D-Dimer, Quant <0.27  0.00 - 0.48 ug/mL-FEU   No results found.   Imaging Review Dg Chest 2 View  04/03/2013   CLINICAL DATA:  Chest pain and fever  EXAM: CHEST  2 VIEW  COMPARISON:  October 11, 2012  FINDINGS: The lungs are clear. The heart size and pulmonary vascularity are normal. No adenopathy. No pneumothorax. There is mild upper thoracic levoscoliosis.  IMPRESSION: No edema or consolidation.   Electronically Signed   By: Bretta BangWilliam  Woodruff M.D.   On: 04/03/2013 12:52     EKG Interpretation None      MDM   Final diagnoses:  Vomiting  Dehydration    Patient presents with vomiting and dizziness. He does report some associated neck pain but he says this feels like similar pain he has had in the past with his neck. He also reports some pain to the left side of his chest and left upper abdomen. He states that that pain has been off and on for the last 2-3 days. I will go away completely at times and then come back again. There's no other suggestions of pulmonary embolus. There is no signs of pneumonia. Given that the pain comes and goes, I did not feel that CT of his abdomen and pelvis was indicated currently. He was given IV fluids and Zofran as well as Toradol. He states he is feeling better after this. He was ambulating around the ED with some improvement of dizziness. He told the tech at that time he still had some dizziness and I ordered a second liter IV fluids. However when I went in to evaluate patient, he said that he a lot better and was ready to go home. He states he only had very mild dizziness at this point. He  doesn't have any headaches that would be more suggestive of intracranial process. I feel that he likely has a viral syndrome. He was given prescription for Zofran and advised to increase his fluids. His blood pressure was mildly elevated in the ED and I encouraged him to have this followed up as an outpatient. I advised him to return here if he has worsening neck pain dizziness headaches fevers or other worsening symptoms.    Rolan BuccoMelanie Ayoub Arey, MD 04/03/13 1714

## 2013-04-03 NOTE — ED Notes (Signed)
States last night he felt "hot and dizzy and started to vomit" reports symptoms have persisted since onset. C/o abd pain also

## 2017-07-13 ENCOUNTER — Other Ambulatory Visit: Payer: Self-pay

## 2017-07-13 ENCOUNTER — Emergency Department (HOSPITAL_COMMUNITY)
Admission: EM | Admit: 2017-07-13 | Discharge: 2017-07-13 | Disposition: A | Discharge status: Home / Self Care | Payer: Self-pay | Attending: Emergency Medicine | Admitting: Emergency Medicine

## 2017-07-13 ENCOUNTER — Emergency Department (HOSPITAL_COMMUNITY): Payer: Self-pay

## 2017-07-13 ENCOUNTER — Encounter (HOSPITAL_COMMUNITY): Payer: Self-pay | Admitting: Emergency Medicine

## 2017-07-13 DIAGNOSIS — T1490XA Injury, unspecified, initial encounter: Secondary | ICD-10-CM

## 2017-07-13 DIAGNOSIS — M79605 Pain in left leg: Secondary | ICD-10-CM | POA: Insufficient documentation

## 2017-07-13 DIAGNOSIS — Z87891 Personal history of nicotine dependence: Secondary | ICD-10-CM | POA: Insufficient documentation

## 2017-07-13 MED ORDER — NAPROXEN 375 MG PO TABS: 375.0000 mg | ORAL_TABLET | Freq: Two times a day (BID) | ORAL | 0 refills | Status: DC

## 2017-07-13 MED ORDER — ACETAMINOPHEN 325 MG PO TABS
650.0000 mg | ORAL_TABLET | Freq: Once | ORAL | Status: AC
  Administered 2017-07-13: 650 mg via ORAL
  Filled 2017-07-13: qty 2

## 2017-07-13 MED ORDER — IBUPROFEN 800 MG PO TABS
800.0000 mg | ORAL_TABLET | Freq: Once | ORAL | Status: AC
  Administered 2017-07-13: 800 mg via ORAL
  Filled 2017-07-13: qty 1

## 2017-07-13 NOTE — Discharge Instructions (Addendum)
Please read and follow all provided instructions.  You have been seen today for an injury to your left knee/lower leg.   Tests performed today include: An x-ray of the affected area - does NOT show any broken bones or dislocations.  Vital signs. See below for your results today.   Home care instructions: -- *PRICE in the first 24-48 hours after injury: Protect (with brace, splint, sling), if given by your provider- wear the knee brace until you have followed up with orthopedics, utilize crutches as needed.  Rest Ice- Do not apply ice pack directly to your skin, place towel or similar between your skin and ice/ice pack. Apply ice for 20 min, then remove for 40 min while awake Compression- Wear brace, elastic bandage, splint as directed by your provider Elevate affected extremity above the level of your heart when not walking around for the first 24-48 hours   Naproxen is a nonsteroidal anti-inflammatory medication that will help with pain and swelling. Be sure to take this medication as prescribed with food, 1 pill every 12 hours,  It should be taken with food, as it can cause stomach upset, and more seriously, stomach bleeding. Do not take other nonsteroidal anti-inflammatory medications with this such as Advil, Motrin, or Aleve.   You may take Tylenol with this medication safely per over-the-counter dosing  We have prescribed you new medication(s) today. Discuss the medications prescribed today with your pharmacist as they can have adverse effects and interactions with your other medicines including over the counter and prescribed medications. Seek medical evaluation if you start to experience new or abnormal symptoms after taking one of these medicines, seek care immediately if you start to experience difficulty breathing, feeling of your throat closing, facial swelling, or rash as these could be indications of a more serious allergic reaction  Follow-up instructions: Please follow-up with  your primary care provider or the provided orthopedic physician (bone specialist) if you continue to have significant pain in 1 week. In this case you may have a more severe injury that requires further care.   Return instructions:  Please return if your toes or feet are numb or tingling, appear gray or blue, or you have severe pain (also elevate the leg and loosen splint or wrap if you were given one) Please return to the Emergency Department if you experience worsening symptoms.  Please return if you have any other emergent concerns. Additional Information:  Your vital signs today were: BP 119/86 (BP Location: Right Arm)    Pulse 87    Temp 99.1 F (37.3 C) (Oral)    Resp 16    Ht 6\' 6"  (1.981 m)    SpO2 96%    BMI 20.28 kg/m  If your blood pressure (BP) was elevated above 135/85 this visit, please have this repeated by your doctor within one month. ---------------

## 2017-07-13 NOTE — ED Notes (Signed)
Ortho paged. 

## 2017-07-13 NOTE — Progress Notes (Signed)
Orthopedic Tech Progress Note Patient Details:  Mario Greene 02/04/1985 [id number]  Ortho Devices Type of Ortho Device: Crutches, Knee Immobilizer Ortho Device/Splint Interventions: Application   Post Interventions Patient Tolerated: Well, Ambulated well Instructions Provided: Care of device   Mario Greene 07/13/2017, 6:23 PM

## 2017-07-13 NOTE — ED Triage Notes (Signed)
Pt presents to Ed for assessment of left lower leg pain after playing basketball this morning.  Patient was kicked by another player in the shin, now experiencing knee pain/swelling and lower leg pain.  Increased pain with ambulation.  Pt took ibuprofen 30 minutes ago.

## 2017-07-13 NOTE — ED Notes (Signed)
Patient Alert and oriented to baseline. Stable and ambulatory to baseline. Patient verbalized understanding of the discharge instructions.  Patient belongings were taken by the patient.   

## 2017-07-13 NOTE — ED Provider Notes (Signed)
[facility] Note   CSN: [id number] Arrival date & time: 07/13/17  1518     History   Chief Complaint Chief Complaint  Patient presents with  . Leg Pain    HPI Mario Greene is a 32 y.o. male without significant past medical hx who presents to the ED complaining of left lower extremity pain s/p injury that occurred this AM at 11:00. Patient states that he was playing basketball when another player's knee collided with the lateral aspect of his left lower leg just distal to the knee. Patient states this did cause him to fall, no head injury or LOC. States he is having pain to the L knee and left lower leg. Rates pain a 9/10 in severity, it had improved with ibuprofen (400 mg) which he took at 12:00 however this has now worn off. Pain is worse with walking, he has not truly beared weight since the incident. Denies numbness, tingling, or weakness.   HPI  History reviewed. No pertinent past medical history.  There are no active problems to display for this patient.   History reviewed. No pertinent surgical history.      Home Medications    Prior to Admission medications   Medication Sig Start Date End Date Taking? Authorizing Provider  albuterol (PROVENTIL HFA;VENTOLIN HFA) 108 (90 BASE) MCG/ACT inhaler Inhale 1 puff into the lungs every 6 (six) hours as needed for shortness of breath.    [provider]  ibuprofen (ADVIL,MOTRIN) 200 MG tablet Take 400 mg by mouth every 6 (six) hours as needed for pain or headache. Fever    [provider]  ondansetron (ZOFRAN ODT) 8 MG disintegrating tablet 8mg  ODT q4 hours prn nausea 04/03/13   Rolan BuccoBelfi, Melanie, MD    Family History History reviewed. No pertinent family history.  Social History Social History   Tobacco Use  . Smoking status: Former Smoker    Types: Cigarettes  . Smokeless tobacco: Never Used  Substance Use Topics  . Alcohol use: No  . Drug use: Yes   Types: Marijuana     Allergies   Patient has no known allergies.   Review of Systems Review of Systems  Constitutional: Negative for chills and fever.  Musculoskeletal: Positive for arthralgias (L knee) and myalgias (L lower leg).  Skin: Negative for color change.  Neurological: Negative for weakness and numbness.     Physical Exam Updated Vital Signs BP 119/86 (BP Location: Right Arm)   Pulse 87   Temp 99.1 F (37.3 C) (Oral)   Resp 16   Ht 6\' 6"  (1.981 m)   SpO2 96%   BMI 20.28 kg/m   Physical Exam  Constitutional: He appears well-developed and well-nourished. No distress.  HENT:  Head: Normocephalic and atraumatic. Head is without raccoon's eyes and without Battle's sign.  Eyes: Conjunctivae are normal. Right eye exhibits no discharge. Left eye exhibits no discharge.  Cardiovascular:  Pulses:      Dorsalis pedis pulses are 2+ on the right side, and 2+ on the left side.       Posterior tibial pulses are 2+ on the right side, and 2+ on the left side.  Musculoskeletal:  Back: No midline tenderness Lower extremities: Patient's left knee appears mildly swollen. No obvious deformities, other appreciable swelling, ecchymosis, erythema, or open wounds.  Compartments are soft.  Patient has normal range of motion to bilateral knees and ankles, he does have pain with left knee flexion.  Patient's knee  and lower leg are diffusely tender, there seems to be most prominent area of tenderness to the medial joint line.  No malleolar tenderness.  No tenderness at the base of the fifth of the foot, no navicular bone tenderness.  Patient has increased pain with valgus stress test (L). No obvious joint instability.  NVI distally  Neurological: He is alert.  Clear speech.  Sensation grossly intact bilateral lower extremities.  5 out of 5 strength with knee flexion and extension as well as ankle plantar and dorsiflexion bilaterally.  Patient's gait is antalgic.  Psychiatric: He has a normal  mood and affect. His behavior is normal. Thought content normal.  Nursing note and vitals reviewed.    ED Treatments / Results  Labs (all labs ordered are listed, but only abnormal results are displayed) Labs Reviewed - No data to display  EKG None  Radiology Dg Tibia/fibula Left  Result Date: 07/13/2017 CLINICAL DATA:  Left knee pain radiating down the lateral lower leg after basketball injury. EXAM: LEFT TIBIA AND FIBULA - 2 VIEW COMPARISON:  Left knee x-rays from same day. FINDINGS: There is no evidence of fracture or other focal bone lesions. Small phleboliths in the anterior lower leg. IMPRESSION: Negative. Electronically Signed   By: Obie Dredge M.D.   On: 07/13/2017 17:32   Dg Knee Complete 4 Views Left  Result Date: 07/13/2017 CLINICAL DATA:  Left thigh and knee pain after playing basketball this morning. EXAM: LEFT KNEE - COMPLETE 4+ VIEW; LEFT FEMUR 2 VIEWS COMPARISON:  None. FINDINGS: No acute fracture or dislocation. Small suprapatellar joint effusion. Probable bone island in the femoral neck. No evidence of arthropathy or other focal bone abnormality. Soft tissues are unremarkable. IMPRESSION: 1. Small knee joint effusion. No acute osseous abnormality of the left femur and knee. Electronically Signed   By: Obie Dredge M.D.   On: 07/13/2017 16:26   Dg Femur Min 2 Views Left  Result Date: 07/13/2017 CLINICAL DATA:  Left thigh and knee pain after playing basketball this morning. EXAM: LEFT KNEE - COMPLETE 4+ VIEW; LEFT FEMUR 2 VIEWS COMPARISON:  None. FINDINGS: No acute fracture or dislocation. Small suprapatellar joint effusion. Probable bone island in the femoral neck. No evidence of arthropathy or other focal bone abnormality. Soft tissues are unremarkable. IMPRESSION: 1. Small knee joint effusion. No acute osseous abnormality of the left femur and knee. Electronically Signed   By: Obie Dredge M.D.   On: 07/13/2017 16:26    Procedures Procedures (including  critical care time)  Medications Ordered in ED Medications  ibuprofen (ADVIL,MOTRIN) tablet 800 mg (800 mg Oral Given 07/13/17 1753)  acetaminophen (TYLENOL) tablet 650 mg (650 mg Oral Given 07/13/17 1753)   Initial Impression / Assessment and Plan / ED Course  I have reviewed the triage vital signs and the nursing notes.  Pertinent labs & imaging results that were available during my care of the patient were reviewed by me and considered in my medical decision making (see chart for details).   Patient presents with left knee and lower leg pain status post injury playing basketball.  Patient nontoxic-appearing, in no apparent distress, vitals WNL.  Patient ROM intact. Compartments are soft. Tender diffusely to knee and lower leg, more prominent to medial joint line. No obvious joint instability. X-rays obtained and negative for fracture/dislocation (femur/knee per triage, subsequently obtained tib/fib). Knee x-ray does have small effusion, suspect traumatic in nature. NVI distally. Considering MCL or medial meniscus injury based on exam and mechanism. Will  place patient in knee immobilizer, crutches provided, Naproxen for pain/swelling. Orthopedics follow up. I discussed results, treatment plan, need for follow-up, and return precautions with the patient. Provided opportunity for questions, patient confirmed understanding and is in agreement with plan.    Final Clinical Impressions(s) / ED Diagnoses   Final diagnoses:  Pain of left lower extremity due to injury    ED Discharge Orders        Ordered    naproxen (NAPROSYN) 375 MG tablet  2 times daily     07/13/17 1747       Maryan Sivak, Pleas Koch, PA-C 07/13/17 1816    Loren Racer, MD 07/16/17 [id number]

## 2017-07-13 NOTE — ED Notes (Signed)
Patient is A&Ox4 at this time.  Patient in no signs of distress.  Please see providers note for complete history and physical exam.  

## 2017-10-21 ENCOUNTER — Emergency Department (HOSPITAL_COMMUNITY): Payer: Self-pay

## 2017-10-21 ENCOUNTER — Encounter (HOSPITAL_COMMUNITY): Payer: Self-pay | Admitting: *Deleted

## 2017-10-21 ENCOUNTER — Emergency Department (HOSPITAL_COMMUNITY)
Admission: EM | Admit: 2017-10-21 | Discharge: 2017-10-21 | Disposition: A | Discharge status: Home / Self Care | Payer: Self-pay | Attending: Emergency Medicine | Admitting: Emergency Medicine

## 2017-10-21 ENCOUNTER — Other Ambulatory Visit: Payer: Self-pay

## 2017-10-21 DIAGNOSIS — Z79899 Other long term (current) drug therapy: Secondary | ICD-10-CM | POA: Insufficient documentation

## 2017-10-21 DIAGNOSIS — X500XXA Overexertion from strenuous movement or load, initial encounter: Secondary | ICD-10-CM | POA: Insufficient documentation

## 2017-10-21 DIAGNOSIS — Y9383 Activity, rough housing and horseplay: Secondary | ICD-10-CM | POA: Insufficient documentation

## 2017-10-21 DIAGNOSIS — Z87891 Personal history of nicotine dependence: Secondary | ICD-10-CM | POA: Insufficient documentation

## 2017-10-21 DIAGNOSIS — M25462 Effusion, left knee: Secondary | ICD-10-CM

## 2017-10-21 DIAGNOSIS — S8392XA Sprain of unspecified site of left knee, initial encounter: Secondary | ICD-10-CM | POA: Insufficient documentation

## 2017-10-21 DIAGNOSIS — Y929 Unspecified place or not applicable: Secondary | ICD-10-CM | POA: Insufficient documentation

## 2017-10-21 DIAGNOSIS — Y999 Unspecified external cause status: Secondary | ICD-10-CM | POA: Insufficient documentation

## 2017-10-21 MED ORDER — IBUPROFEN 800 MG PO TABS: 800.0000 mg | ORAL_TABLET | Freq: Three times a day (TID) | ORAL | 0 refills | Status: DC | PRN

## 2017-10-21 NOTE — ED Provider Notes (Signed)
[facility] EMERGENCY DEPARTMENT Provider Note   CSN: [id number] Arrival date & time: 10/21/17  1012     History   Chief Complaint Chief Complaint  Patient presents with  . Knee Pain    HPI Mario Greene is a 32 y.o. male.  He presents to the emergency department with pain in his left knee since yesterday when he had an injury.  He said he was horsing around with his son and had his left knee hyperextended.  There was immediate pain and he has been having difficulty walking since then.  This morning he woke up to find it swollen.  There is no associated numbness.  There is weakness of the lower extremity but it secondary to pain.  He had a prior knee injury a few months ago to the lateral side of the knee and he has crutches and a knee immobilizer from that.  He said that improved without any intervention and he did have her followed up with anybody.   Knee Pain   This is a new problem. The current episode started yesterday. The problem occurs constantly. The problem has not changed since onset.The pain is present in the left knee. The quality of the pain is described as aching. The pain is severe. Associated symptoms include limited range of motion and stiffness. Pertinent negatives include no numbness and no tingling. The symptoms are aggravated by activity and standing. He has tried rest and cold for the symptoms. The treatment provided mild relief.    History reviewed. No pertinent past medical history.  There are no active problems to display for this patient.   History reviewed. No pertinent surgical history.      Home Medications    Prior to Admission medications   Medication Sig Start Date End Date Taking? Authorizing Provider  albuterol (PROVENTIL HFA;VENTOLIN HFA) 108 (90 BASE) MCG/ACT inhaler Inhale 1 puff into the lungs every 6 (six) hours as needed for shortness of breath.    [provider]  ibuprofen (ADVIL,MOTRIN) 200 MG tablet Take  400 mg by mouth every 6 (six) hours as needed for pain or headache. Fever    [provider]  naproxen (NAPROSYN) 375 MG tablet Take 1 tablet (375 mg total) by mouth 2 (two) times daily. 07/13/17   Petrucelli, Lelon Mast R, PA-C  ondansetron (ZOFRAN ODT) 8 MG disintegrating tablet 8mg  ODT q4 hours prn nausea 04/03/13   Rolan Bucco, MD    Family History No family history on file.  Social History Social History   Tobacco Use  . Smoking status: Former Smoker    Packs/day: 0.50    Types: Cigarettes  . Smokeless tobacco: Never Used  Substance Use Topics  . Alcohol use: No  . Drug use: Yes    Types: Marijuana     Allergies   Patient has no known allergies.   Review of Systems Review of Systems  Constitutional: Negative for fever.  HENT: Negative for sore throat.   Respiratory: Negative for shortness of breath.   Cardiovascular: Negative for chest pain.  Gastrointestinal: Negative for abdominal pain.  Genitourinary: Negative for dysuria.  Musculoskeletal: Positive for gait problem and stiffness. Negative for neck pain.  Skin: Negative for rash.  Neurological: Negative for tingling and numbness.     Physical Exam Updated Vital Signs BP (!) 121/99 (BP Location: Left Arm)   Pulse 97   Temp 97.6 F (36.4 C) (Oral)   Resp 16   SpO2 98%   Physical  Exam  Constitutional: He appears well-developed and well-nourished.  HENT:  Head: Normocephalic and atraumatic.  Eyes: Conjunctivae are normal.  Neck: Neck supple.  Pulmonary/Chest: Effort normal.  Musculoskeletal:       Left knee: He exhibits decreased range of motion, swelling and effusion. He exhibits no laceration and no erythema. No medial joint line, no lateral joint line and no patellar tendon tenderness noted.  His left knee has a moderate effusion.  Extensor mechanism is intact.  He has pain with all ligamentous testing but I am not identifying a specific laxity.  Is diffuse anterior bony tenderness but x-rays  are negative.  No pain in the calf ankle hip.  Distal sensation and cap refill brisk.  Neurological: He is alert. GCS eye subscore is 4. GCS verbal subscore is 5. GCS motor subscore is 6.  Skin: Skin is warm and dry.  Psychiatric: He has a normal mood and affect.  Nursing note and vitals reviewed.    ED Treatments / Results  Labs (all labs ordered are listed, but only abnormal results are displayed) Labs Reviewed - No data to display  EKG None  Radiology Dg Knee Complete 4 Views Left  Result Date: 10/21/2017 CLINICAL DATA:  LEFT lateral knee injury 3 months ago, re-injured last night, pain and swelling, unable to bear weight due to pain EXAM: LEFT KNEE - COMPLETE 4+ VIEW COMPARISON:  07/13/2017 FINDINGS: Osseous mineralization normal. Joint spaces preserved. No acute fracture, dislocation, or bone destruction. Small to moderate knee joint effusion present increased versus prior exam. IMPRESSION: Small to moderate knee joint effusion without acute osseous abnormality. Electronically Signed   By: Ulyses Southward M.D.   On: 10/21/2017 11:14    Procedures Procedures (including critical care time)  Medications Ordered in ED Medications - No data to display   Initial Impression / Assessment and Plan / ED Course  I have reviewed the triage vital signs and the nursing notes.  Pertinent labs & imaging results that were available during my care of the patient were reviewed by me and considered in my medical decision making (see chart for details).  Clinical Course as of Oct 23 1442  Mon Oct 21, 2017  1136 Patient with a injury to his left knee with likely traumatic effusion and diffuse pain.  X-rays are negative for fracture.  He has a knee immobilizer and crutches already.  I will prescribe him some ibuprofen and a note for work and he will need orthopedics follow up.  Patient in agreement with plan and will return if any problems.   [MB]    Clinical Course User Index [MB] Terrilee Files, MD      Final Clinical Impressions(s) / ED Diagnoses   Final diagnoses:  Sprain of left knee, unspecified ligament, initial encounter  Effusion of left knee    ED Discharge Orders         Ordered    ibuprofen (ADVIL,MOTRIN) 800 MG tablet  Every 8 hours PRN     10/21/17 1138           Terrilee Files, MD 10/22/17 1444

## 2017-10-21 NOTE — Discharge Instructions (Addendum)
You were evaluated in the emergency department for an injury to your left knee.  Your x-rays did not show an obvious fracture.  There is likely a sprain or even tear of the ligaments or cartilage and you will need to have orthopedic follow-up.  Continue to use your knee immobilizer and crutches.  Ice 20 minutes on 20 minutes off.  Ibuprofen 3 times a day.  Return if any concerns.

## 2017-10-21 NOTE — ED Triage Notes (Signed)
Pt in c/o L knee pain onset yesterday when his son landed on it while playing, hx of level 2 L knee sprain x 3 mths, pt walking with crutches today, pt has swelling to L knee, A&O x4

## 2017-10-21 NOTE — ED Notes (Signed)
Called xray. Patient will be transported from xray to [facility].

## 2018-12-24 ENCOUNTER — Emergency Department: Payer: Self-pay

## 2018-12-24 ENCOUNTER — Emergency Department
Admission: EM | Admit: 2018-12-24 | Discharge: 2018-12-24 | Disposition: A | Discharge status: Home / Self Care | Payer: Self-pay | Attending: Emergency Medicine | Admitting: Emergency Medicine

## 2018-12-24 ENCOUNTER — Other Ambulatory Visit: Payer: Self-pay

## 2018-12-24 ENCOUNTER — Encounter: Payer: Self-pay | Admitting: Emergency Medicine

## 2018-12-24 DIAGNOSIS — Z87891 Personal history of nicotine dependence: Secondary | ICD-10-CM | POA: Insufficient documentation

## 2018-12-24 DIAGNOSIS — Y999 Unspecified external cause status: Secondary | ICD-10-CM | POA: Insufficient documentation

## 2018-12-24 DIAGNOSIS — S93401A Sprain of unspecified ligament of right ankle, initial encounter: Secondary | ICD-10-CM | POA: Insufficient documentation

## 2018-12-24 DIAGNOSIS — Y939 Activity, unspecified: Secondary | ICD-10-CM | POA: Insufficient documentation

## 2018-12-24 DIAGNOSIS — W010XXA Fall on same level from slipping, tripping and stumbling without subsequent striking against object, initial encounter: Secondary | ICD-10-CM | POA: Insufficient documentation

## 2018-12-24 DIAGNOSIS — Y929 Unspecified place or not applicable: Secondary | ICD-10-CM | POA: Insufficient documentation

## 2018-12-24 MED ORDER — MELOXICAM 15 MG PO TABS: 15.0000 mg | ORAL_TABLET | Freq: Every day | ORAL | 2 refills | Status: AC

## 2018-12-24 NOTE — Discharge Instructions (Addendum)
Follow-up with orthopedics if not improving in 5 days.  Elevate and ice the ankle.  Wear stirrup splint for extra support.  Return if worsening.

## 2018-12-24 NOTE — ED Provider Notes (Signed)
[facility] Emergency Department Provider Note  ____________________________________________   First MD Initiated Contact with Patient 12/24/18 1019     (approximate)  I have reviewed the triage vital signs and the nursing notes.   HISTORY  Chief Complaint Ankle Pain    HPI Mario Greene is a 33 y.o. male presents emergency department complaint of right ankle pain.  States he slipped and fell turning his ankle.  Heard 2 loud pops.  Went to work and the pain became worse.  No numbness or tingling.    History reviewed. No pertinent past medical history.  There are no active problems to display for this patient.   History reviewed. No pertinent surgical history.  Prior to Admission medications   Medication Sig Start Date End Date Taking? Authorizing Provider  albuterol (PROVENTIL HFA;VENTOLIN HFA) 108 (90 BASE) MCG/ACT inhaler Inhale 1 puff into the lungs every 6 (six) hours as needed for shortness of breath.    [provider]  meloxicam (MOBIC) 15 MG tablet Take 1 tablet (15 mg total) by mouth daily. 12/24/18 12/24/19  Versie Starks, PA-C    Allergies Patient has no known allergies.  No family history on file.  Social History Social History   Tobacco Use  . Smoking status: Former Smoker    Packs/day: 0.50    Types: Cigarettes  . Smokeless tobacco: Never Used  Substance Use Topics  . Alcohol use: Yes    Comment: occasional  . Drug use: Yes    Types: Marijuana    Review of Systems  Constitutional: No fever/chills Eyes: No visual changes. ENT: No sore throat. Respiratory: Denies cough Genitourinary: Negative for dysuria. Musculoskeletal: Negative for back pain.  Ankle pain Skin: Negative for rash.    ____________________________________________   PHYSICAL EXAM:  VITAL SIGNS: ED Triage Vitals  Enc Vitals Group     BP 12/24/18 1008 (!) 135/91     Pulse Rate 12/24/18 1008 77     Resp 12/24/18 1008 16     Temp  12/24/18 1008 99.1 F (37.3 C)     Temp Source 12/24/18 1008 Oral     SpO2 12/24/18 1008 99 %     Weight 12/24/18 1009 190 lb (86.2 kg)     Height 12/24/18 1009 6\' 6"  (1.981 m)     Head Circumference --      Peak Flow --      Pain Score 12/24/18 1008 7     Pain Loc --      Pain Edu? --      Excl. in [place]? --     Constitutional: Alert and oriented. Well appearing and in no acute distress. Eyes: Conjunctivae are normal.  Head: Atraumatic. Nose: No congestion/rhinnorhea. Mouth/Throat: Mucous membranes are moist.   Neck:  supple no lymphadenopathy noted Cardiovascular: Normal rate, regular rhythm.  Respiratory: Normal respiratory effort.  No retractions,  GU: deferred Musculoskeletal: FROM all extremities, warm and well perfused, right ankle is tender at the Achilles and anteriorly, neurovascular intact, full range of motion Neurologic:  Normal speech and language.  Skin:  Skin is warm, dry and intact. No rash noted. Psychiatric: Mood and affect are normal. Speech and behavior are normal.  ____________________________________________   LABS (all labs ordered are listed, but only abnormal results are displayed)  Labs Reviewed - No data to display ____________________________________________   ____________________________________________  RADIOLOGY  X-ray of the right ankle is negative  ____________________________________________   PROCEDURES  Procedure(s) performed: Stirrup splint applied  Procedures    ____________________________________________   INITIAL IMPRESSION / ASSESSMENT AND PLAN / ED COURSE  Pertinent labs & imaging results that were available during my care of the patient were reviewed by me and considered in my medical decision making (see chart for details).   Patient is 33 year old male presents emergency department with right ankle pain.  See HPI  Physical exam shows patient appears well.  Right ankle is tender at the Achilles and anteriorly.   X-ray of the right ankle is negative.  Explained the findings to the patient.  He was placed in a stirrup splint.  Given prescription for meloxicam.  He is to follow-up with orthopedics if not better in 1 week.  Return if worsening.  Is discharged stable condition.    Mario Greene was evaluated in Emergency Department on 12/24/2018 for the symptoms described in the history of present illness. He was evaluated in the context of the global COVID-19 pandemic, which necessitated consideration that the patient might be at risk for infection with the SARS-CoV-2 virus that causes COVID-19. Institutional protocols and algorithms that pertain to the evaluation of patients at risk for COVID-19 are in a state of rapid change based on information released by regulatory bodies including the CDC and federal and state organizations. These policies and algorithms were followed during the patient's care in the ED.   As part of my medical decision making, I reviewed the following data within the electronic MEDICAL RECORD NUMBER Nursing notes reviewed and incorporated, Old chart reviewed, Radiograph reviewed , Notes from prior ED visits and [place] Controlled Substance Database  ____________________________________________   FINAL CLINICAL IMPRESSION(S) / ED DIAGNOSES  Final diagnoses:  Sprain of right ankle, unspecified ligament, initial encounter      NEW MEDICATIONS STARTED DURING THIS VISIT:  New Prescriptions   MELOXICAM (MOBIC) 15 MG TABLET    Take 1 tablet (15 mg total) by mouth daily.     Note:  This document was prepared using Dragon voice recognition software and may include unintentional dictation errors.    Faythe Ghee, PA-C 12/24/18 1101    Shaune Pollack, MD 12/26/18 [phone]

## 2018-12-24 NOTE — ED Triage Notes (Signed)
Patient reports he slipped yesterday and fell, hurting his right ankle. Reports continued pain and a small knot below ankle today. Denies any other injuries

## 2018-12-24 NOTE — ED Notes (Signed)
AAOx3.  Skin warm and dry.  NAD 

## 2018-12-25 ENCOUNTER — Other Ambulatory Visit: Payer: Self-pay

## 2018-12-25 DIAGNOSIS — Z20822 Contact with and (suspected) exposure to covid-19: Secondary | ICD-10-CM

## 2018-12-27 LAB — NOVEL CORONAVIRUS, NAA: SARS-CoV-2, NAA: NOT DETECTED

## 2021-06-22 IMAGING — DX DG ANKLE COMPLETE 3+V*R*
3 series · 3 of 3 positions shown · non-contrast
Comparison: None.

CLINICAL DATA: Fall with right ankle pain

EXAM:
RIGHT ANKLE - COMPLETE 3+ VIEW

[ankle ap]
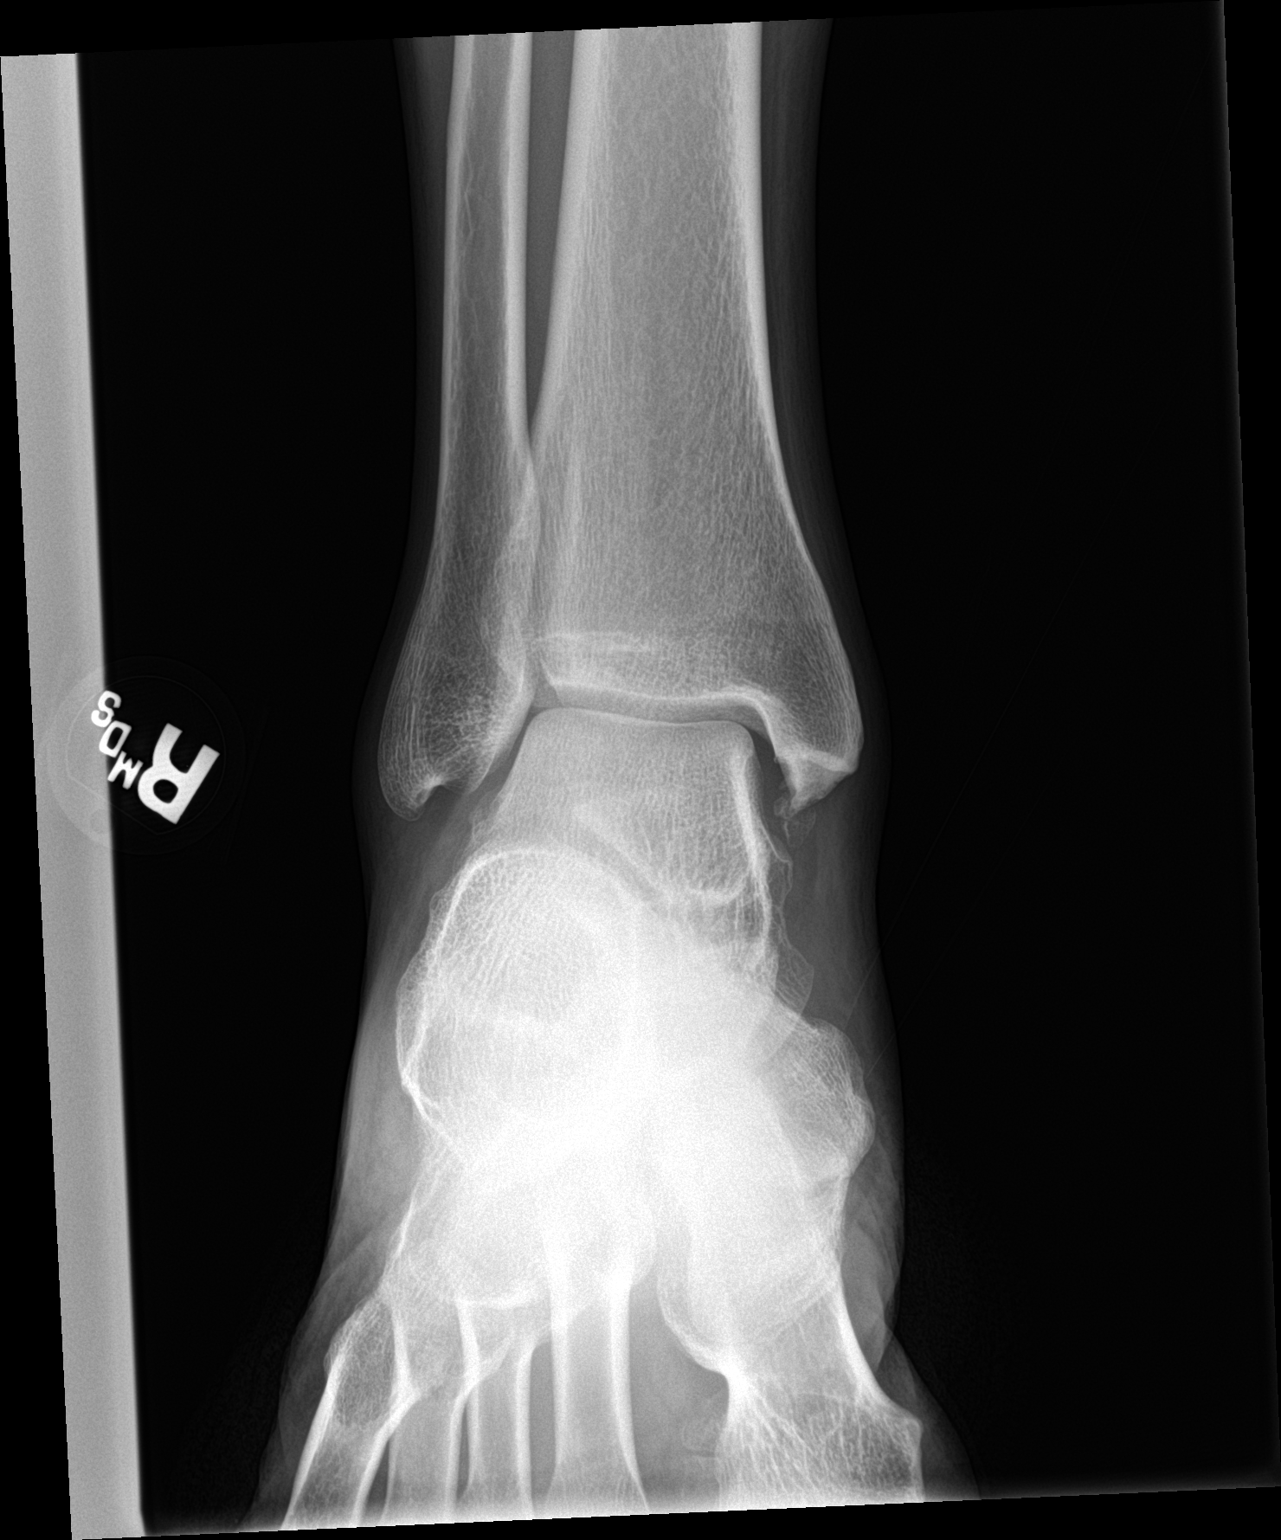

[ankle obl]
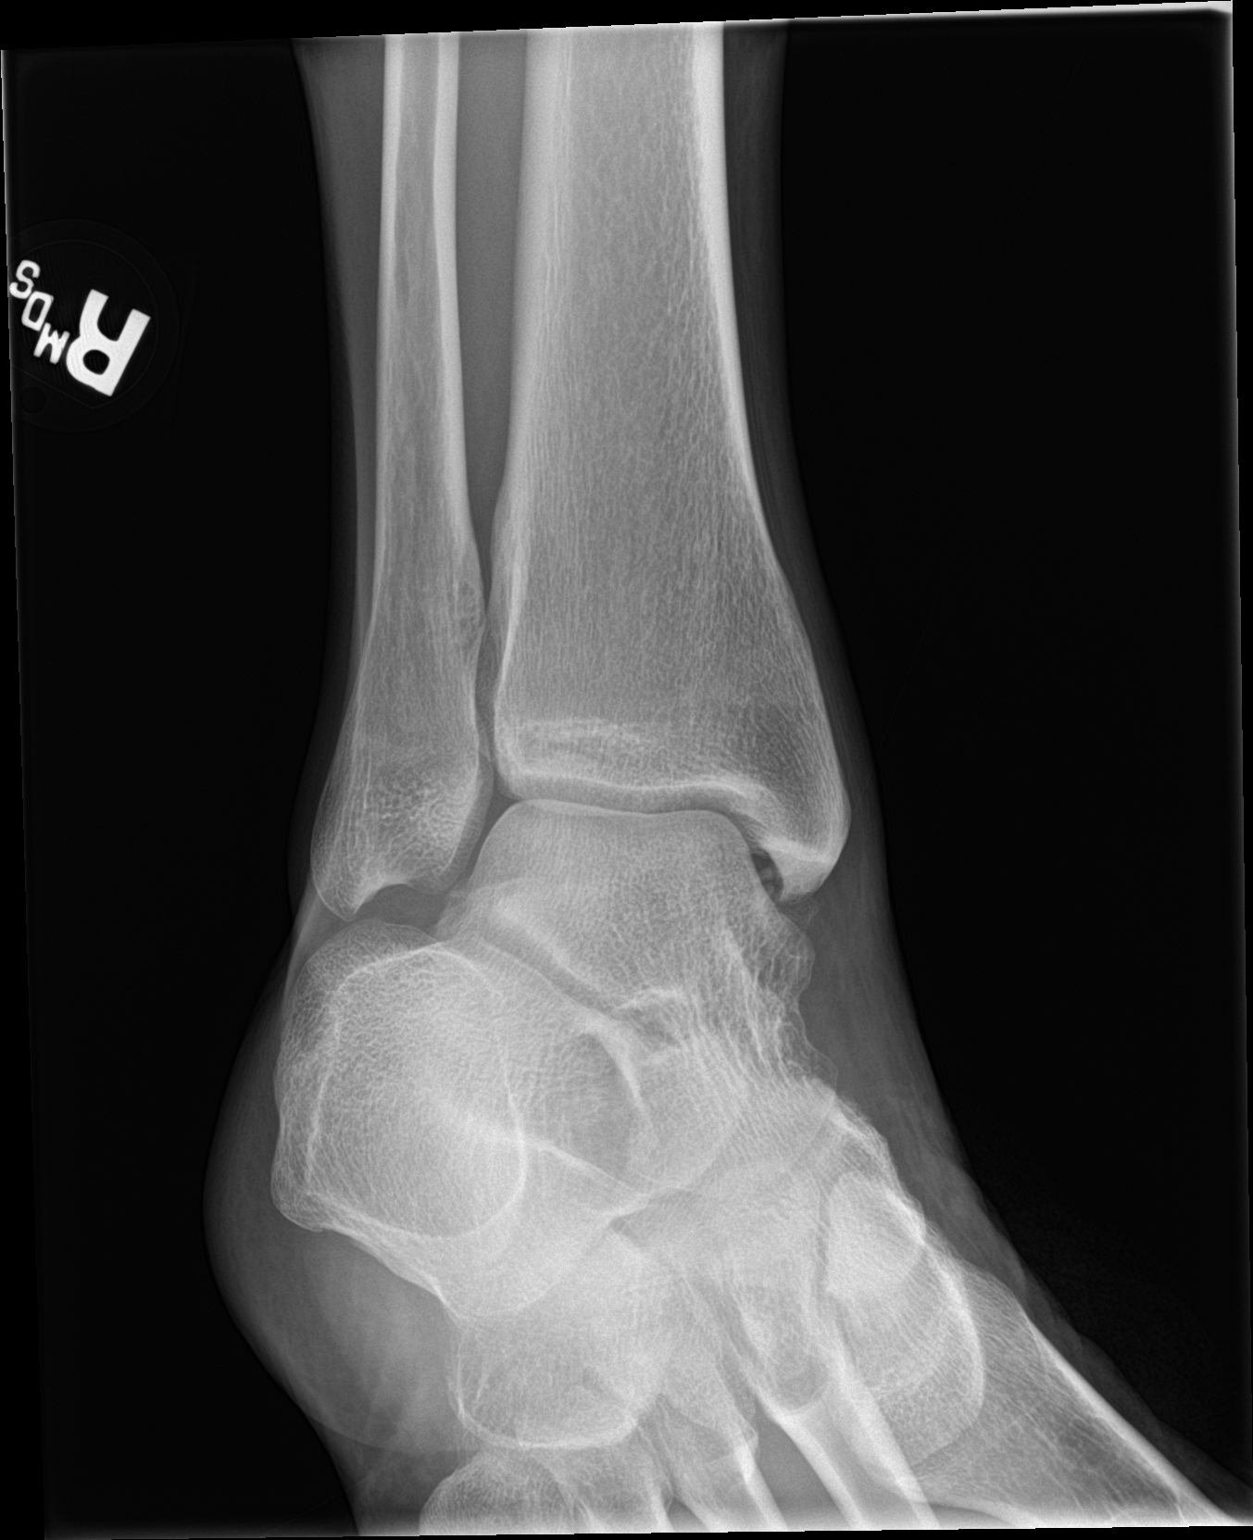

[ankle lat]
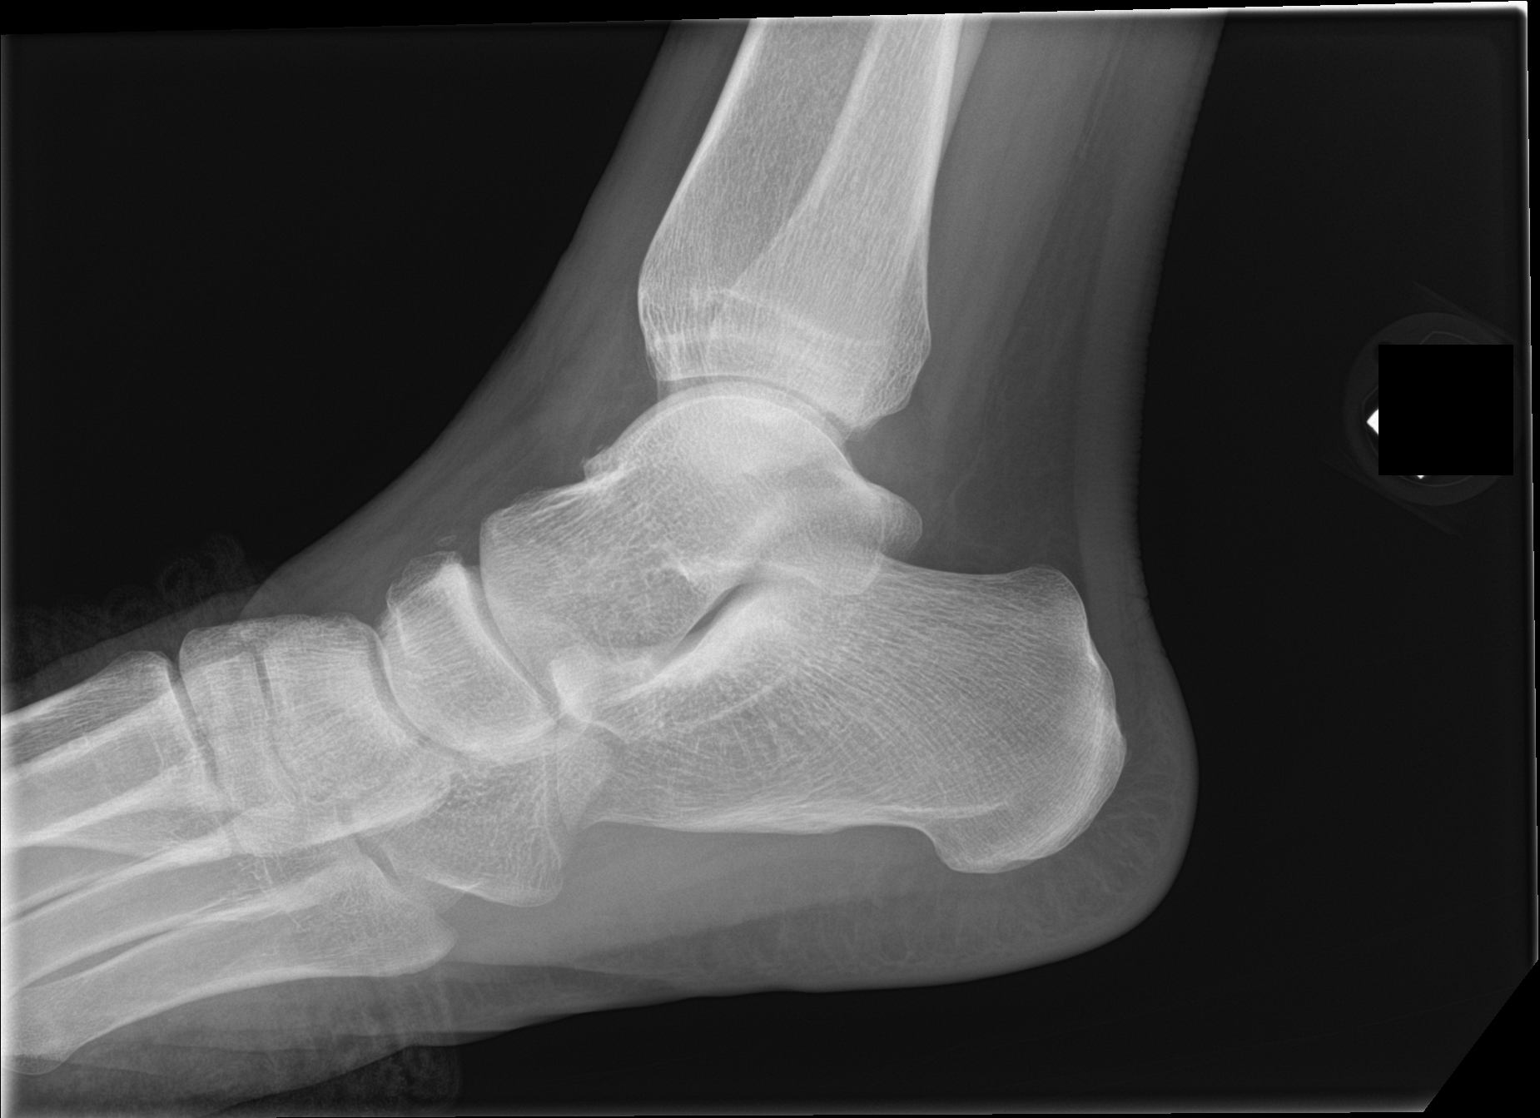

[3 of 3 positions shown; findings below may reference images not displayed]

FINDINGS: Corticated ossicles below the medial malleolus and dorsal to the
navicular. No acute fracture or malalignment. No suspected joint
effusion.
IMPRESSION: Negative.
